# Patient Record
Sex: Male | Born: 1995 | Race: Black or African American | Hispanic: No | Marital: Single | Smoking: Never smoker
Health system: Southern US, Community
[De-identification: ages and names within clinical notes are randomized; demographics above are authoritative.]

---
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
## 2020-03-17 ENCOUNTER — Other Ambulatory Visit: Payer: Self-pay

## 2020-03-17 ENCOUNTER — Ambulatory Visit (INDEPENDENT_AMBULATORY_CARE_PROVIDER_SITE_OTHER): Payer: Self-pay

## 2020-03-17 ENCOUNTER — Other Ambulatory Visit: Payer: Self-pay | Admitting: Physician Assistant

## 2020-03-17 DIAGNOSIS — S99922A Unspecified injury of left foot, initial encounter: Secondary | ICD-10-CM
